# Patient Record
Sex: Female | Born: 1990 | Race: White | Hispanic: No | Smoking: Never smoker
Health system: Southern US, Community
[De-identification: ages and names within clinical notes are randomized; demographics above are authoritative.]

---

## 2020-05-05 ENCOUNTER — Observation Stay (HOSPITAL_BASED_OUTPATIENT_CLINIC_OR_DEPARTMENT_OTHER)
Admission: EM | Admit: 2020-05-05 | Discharge: 2020-05-07 | Disposition: A | Discharge status: Home / Self Care | Payer: 59 | Attending: Surgery | Admitting: Surgery

## 2020-05-05 ENCOUNTER — Other Ambulatory Visit: Payer: Self-pay

## 2020-05-05 ENCOUNTER — Emergency Department (HOSPITAL_BASED_OUTPATIENT_CLINIC_OR_DEPARTMENT_OTHER): Payer: 59

## 2020-05-05 ENCOUNTER — Encounter (HOSPITAL_BASED_OUTPATIENT_CLINIC_OR_DEPARTMENT_OTHER): Payer: Self-pay

## 2020-05-05 DIAGNOSIS — R1011 Right upper quadrant pain: Secondary | ICD-10-CM | POA: Diagnosis present

## 2020-05-05 DIAGNOSIS — Z20822 Contact with and (suspected) exposure to covid-19: Secondary | ICD-10-CM | POA: Diagnosis not present

## 2020-05-05 DIAGNOSIS — K8050 Calculus of bile duct without cholangitis or cholecystitis without obstruction: Secondary | ICD-10-CM

## 2020-05-05 DIAGNOSIS — K810 Acute cholecystitis: Principal | ICD-10-CM | POA: Insufficient documentation

## 2020-05-05 LAB — CBC WITH DIFFERENTIAL/PLATELET
Abs Immature Granulocytes: 0.02 10*3/uL (ref 0.00–0.07)
Basophils Absolute: 0.1 10*3/uL (ref 0.0–0.1)
Basophils Relative: 1 %
Eosinophils Absolute: 0.2 10*3/uL (ref 0.0–0.5)
Eosinophils Relative: 2 %
HCT: 38.6 % (ref 36.0–46.0)
Hemoglobin: 12.7 g/dL (ref 12.0–15.0)
Immature Granulocytes: 0 %
Lymphocytes Relative: 34 %
Lymphs Abs: 2.5 10*3/uL (ref 0.7–4.0)
MCH: 29.1 pg (ref 26.0–34.0)
MCHC: 32.9 g/dL (ref 30.0–36.0)
MCV: 88.5 fL (ref 80.0–100.0)
Monocytes Absolute: 0.6 10*3/uL (ref 0.1–1.0)
Monocytes Relative: 8 %
Neutro Abs: 4.1 10*3/uL (ref 1.7–7.7)
Neutrophils Relative %: 55 %
Platelets: 223 10*3/uL (ref 150–400)
RBC: 4.36 MIL/uL (ref 3.87–5.11)
RDW: 12.9 % (ref 11.5–15.5)
WBC: 7.4 10*3/uL (ref 4.0–10.5)
nRBC: 0 % (ref 0.0–0.2)

## 2020-05-05 LAB — COMPREHENSIVE METABOLIC PANEL
ALT: 14 U/L (ref 0–44)
AST: 17 U/L (ref 15–41)
Albumin: 4 g/dL (ref 3.5–5.0)
Alkaline Phosphatase: 31 U/L — ABNORMAL LOW (ref 38–126)
Anion gap: 7 (ref 5–15)
BUN: 17 mg/dL (ref 6–20)
CO2: 26 mmol/L (ref 22–32)
Calcium: 8.9 mg/dL (ref 8.9–10.3)
Chloride: 103 mmol/L (ref 98–111)
Creatinine, Ser: 0.62 mg/dL (ref 0.44–1.00)
GFR, Estimated: >60 mL/min (ref >60)
Glucose, Bld: 101 mg/dL — ABNORMAL HIGH (ref 70–99)
Potassium: 3.7 mmol/L (ref 3.5–5.1)
Sodium: 136 mmol/L (ref 135–145)
Total Bilirubin: 0.5 mg/dL (ref 0.3–1.2)
Total Protein: 7.2 g/dL (ref 6.5–8.1)

## 2020-05-05 LAB — URINALYSIS, ROUTINE W REFLEX MICROSCOPIC
Bilirubin Urine: NEGATIVE
Glucose, UA: NEGATIVE mg/dL
Hgb urine dipstick: NEGATIVE
Ketones, ur: NEGATIVE mg/dL
Leukocytes,Ua: NEGATIVE
Nitrite: NEGATIVE
Protein, ur: NEGATIVE mg/dL
Specific Gravity, Urine: 1.015 (ref 1.005–1.030)
pH: 7.5 (ref 5.0–8.0)

## 2020-05-05 LAB — PREGNANCY, URINE: Preg Test, Ur: NEGATIVE

## 2020-05-05 LAB — RESP PANEL BY RT-PCR (FLU A&B, COVID) ARPGX2
Influenza A by PCR: NEGATIVE
Influenza B by PCR: NEGATIVE
SARS Coronavirus 2 by RT PCR: NEGATIVE

## 2020-05-05 LAB — LIPASE, BLOOD: Lipase: 40 U/L (ref 11–51)

## 2020-05-05 MED ORDER — ONDANSETRON 4 MG PO TBDP: 4.0000 mg | ORAL_TABLET | Freq: Four times a day (QID) | ORAL | Status: DC | PRN

## 2020-05-05 MED ORDER — ONDANSETRON HCL 4 MG/2ML IJ SOLN
4.0000 mg | Freq: Four times a day (QID) | INTRAMUSCULAR | Status: DC | PRN
  Administered 2020-05-06 – 2020-05-07 (×3): 4 mg via INTRAVENOUS
  Filled 2020-05-05 (×3): qty 2

## 2020-05-05 MED ORDER — PROMETHAZINE HCL 25 MG/ML IJ SOLN
12.5000 mg | Freq: Once | INTRAMUSCULAR | Status: AC
  Administered 2020-05-05: 12.5 mg via INTRAVENOUS
  Filled 2020-05-05: qty 1

## 2020-05-05 MED ORDER — ENOXAPARIN SODIUM 40 MG/0.4ML ~~LOC~~ SOLN
40.0000 mg | SUBCUTANEOUS | Status: DC
  Administered 2020-05-05 – 2020-05-06 (×2): 40 mg via SUBCUTANEOUS
  Filled 2020-05-05 (×2): qty 0.4

## 2020-05-05 MED ORDER — ONDANSETRON 4 MG PO TBDP
4.0000 mg | ORAL_TABLET | Freq: Four times a day (QID) | ORAL | Status: DC | PRN
  Filled 2020-05-05: qty 1

## 2020-05-05 MED ORDER — FAMOTIDINE IN NACL 20-0.9 MG/50ML-% IV SOLN
20.0000 mg | Freq: Once | INTRAVENOUS | Status: AC
  Administered 2020-05-05: 20 mg via INTRAVENOUS
  Filled 2020-05-05: qty 50

## 2020-05-05 MED ORDER — ONDANSETRON HCL 4 MG/2ML IJ SOLN
4.0000 mg | Freq: Once | INTRAMUSCULAR | Status: AC
  Administered 2020-05-05: 4 mg via INTRAVENOUS
  Filled 2020-05-05: qty 2

## 2020-05-05 MED ORDER — KETOROLAC TROMETHAMINE 30 MG/ML IJ SOLN: 30.0000 mg | Freq: Once | INTRAMUSCULAR | Status: DC

## 2020-05-05 MED ORDER — ONDANSETRON HCL 4 MG/2ML IJ SOLN: 4.0000 mg | Freq: Four times a day (QID) | INTRAMUSCULAR | Status: DC | PRN

## 2020-05-05 MED ORDER — CIPROFLOXACIN IN D5W 400 MG/200ML IV SOLN
400.0000 mg | Freq: Two times a day (BID) | INTRAVENOUS | Status: DC
  Administered 2020-05-05 – 2020-05-06 (×2): 400 mg via INTRAVENOUS
  Filled 2020-05-05 (×2): qty 200

## 2020-05-05 MED ORDER — DEXTROSE-NACL 5-0.9 % IV SOLN: INTRAVENOUS | Status: DC

## 2020-05-05 MED ORDER — HYDROMORPHONE HCL 1 MG/ML IJ SOLN
1.0000 mg | INTRAMUSCULAR | Status: DC | PRN
  Administered 2020-05-05: 1 mg via INTRAVENOUS
  Filled 2020-05-05: qty 1

## 2020-05-05 MED ORDER — FENTANYL CITRATE (PF) 100 MCG/2ML IJ SOLN
50.0000 ug | Freq: Once | INTRAMUSCULAR | Status: AC
  Administered 2020-05-05: 50 ug via INTRAVENOUS
  Filled 2020-05-05: qty 2

## 2020-05-05 MED ORDER — METOPROLOL TARTRATE 5 MG/5ML IV SOLN: 5.0000 mg | Freq: Four times a day (QID) | INTRAVENOUS | Status: DC | PRN

## 2020-05-05 MED ORDER — HYDROMORPHONE HCL 1 MG/ML IJ SOLN: 1.0000 mg | INTRAMUSCULAR | Status: DC | PRN

## 2020-05-05 MED ORDER — ZOLPIDEM TARTRATE 5 MG PO TABS
5.0000 mg | ORAL_TABLET | Freq: Every evening | ORAL | Status: DC | PRN
Start: 2020-05-05 — End: 2020-05-07

## 2020-05-05 MED ORDER — CIPROFLOXACIN IN D5W 400 MG/200ML IV SOLN: 400.0000 mg | Freq: Two times a day (BID) | INTRAVENOUS | Status: DC

## 2020-05-05 MED ORDER — ENOXAPARIN SODIUM 40 MG/0.4ML ~~LOC~~ SOLN: 40.0000 mg | SUBCUTANEOUS | Status: DC

## 2020-05-05 MED ORDER — SODIUM CHLORIDE 0.9 % IV BOLUS
1000.0000 mL | Freq: Once | INTRAVENOUS | Status: AC
  Administered 2020-05-05: 1000 mL via INTRAVENOUS

## 2020-05-05 MED ORDER — SIMETHICONE 80 MG PO CHEW
40.0000 mg | CHEWABLE_TABLET | Freq: Four times a day (QID) | ORAL | Status: DC | PRN
  Administered 2020-05-06 – 2020-05-07 (×2): 40 mg via ORAL
  Filled 2020-05-05 (×3): qty 1

## 2020-05-05 MED ORDER — MORPHINE SULFATE (PF) 4 MG/ML IV SOLN
4.0000 mg | Freq: Once | INTRAVENOUS | Status: AC
  Administered 2020-05-05: 4 mg via INTRAVENOUS
  Filled 2020-05-05: qty 1

## 2020-05-05 MED ORDER — ZOLPIDEM TARTRATE 5 MG PO TABS: 5.0000 mg | ORAL_TABLET | Freq: Every evening | ORAL | Status: DC | PRN

## 2020-05-05 NOTE — ED Triage Notes (Signed)
Pt c/o upper abd pain since June 2021-states she was sent from [facility]

## 2020-05-05 NOTE — ED Notes (Signed)
ED Provider at bedside. 

## 2020-05-05 NOTE — ED Notes (Signed)
Pt reports RUQ and mid upper abdominal pain off and on since June. Pt went to [facility] for sinus infection. States RUQ pain for 2 days which is worse than usual and they sent pt to [facility] for evaluation.

## 2020-05-05 NOTE — ED Provider Notes (Addendum)
[facility] EMERGENCY DEPARTMENT Provider Note   CSN: [id number] Arrival date & time: 05/05/20  1543     History Chief Complaint  Patient presents with  . Abdominal Pain    Ariel Hall is a 30 y.o. female here presenting with abdominal pain and vomiting.  Patient states that she has been having intermittent upper abdominal pain and vomiting since June.  She states that she had Covid in August and since then she had epigastric pain that is intermittent.  She states that she was told that she had a stomach ulcer but never had endoscopy.  She is on Prilosec as needed.  She states that for the last 2 days, she has worsening abdominal pain and vomiting.  She also has sinus congestion as well.  She states that she did not have any Covid exposures recently and received the vaccine but she wants to get tested for Covid.  She went to urgent care and was sent in for further evaluation.  Denies any abdominal surgeries in the past.  Denies any lower abdominal pain or pelvic discharge.   The history is provided by the patient.       History reviewed. No pertinent past medical history.  There are no problems to display for this patient.   History reviewed. No pertinent surgical history.   OB History   No obstetric history on file.     No family history on file.  Social History   Tobacco Use  . Smoking status: Never Smoker  . Smokeless tobacco: Never Used  Vaping Use  . Vaping Use: Never used  Substance Use Topics  . Alcohol use: Not Currently  . Drug use: Not Currently    Home Medications Prior to Admission medications   Not on File    Allergies    Amoxil [amoxicillin]  Review of Systems   Review of Systems  Gastrointestinal: Positive for abdominal pain and vomiting.  All other systems reviewed and are negative.   Physical Exam Updated Vital Signs BP 117/68 (BP Location: Right Arm)   Pulse 79   Temp 98.3 F (36.8 C) (Oral)   Resp 18   Ht 5\' 5"  (1.651  m)   Wt 76.2 kg   LMP 04/08/2020   SpO2 95%   BMI 27.96 kg/m   Physical Exam Vitals and nursing note reviewed.  Constitutional:      Comments: Slightly uncomfortable, vomiting   HENT:     Head: Normocephalic.     Mouth/Throat:     Pharynx: Oropharynx is clear.  Eyes:     Extraocular Movements: Extraocular movements intact.     Pupils: Pupils are equal, round, and reactive to light.  Cardiovascular:     Rate and Rhythm: Normal rate and regular rhythm.     Heart sounds: Normal heart sounds.  Pulmonary:     Effort: Pulmonary effort is normal.     Breath sounds: Normal breath sounds.  Abdominal:     General: Abdomen is flat.     Comments: Mild RUQ and epigastric tenderness, mild R CVAT   Skin:    General: Skin is warm.  Neurological:     General: No focal deficit present.  Psychiatric:        Mood and Affect: Mood normal.        Behavior: Behavior normal.     ED Results / Procedures / Treatments   Labs (all labs ordered are listed, but only abnormal results are displayed) Labs Reviewed  COMPREHENSIVE METABOLIC  PANEL - Abnormal; Notable for the following components:      Result Value   Glucose, Bld 101 (*)    Alkaline Phosphatase 31 (*)    All other components within normal limits  SARS CORONAVIRUS 2 (TAT 6-24 HRS)  RESP PANEL BY RT-PCR (FLU A&B, COVID) ARPGX2  URINALYSIS, ROUTINE W REFLEX MICROSCOPIC  PREGNANCY, URINE  CBC WITH DIFFERENTIAL/PLATELET  LIPASE, BLOOD    EKG None  Radiology US Abdomen Complete  Result Date: 05/05/2020 CLINICAL DATA:  Right upper quadrant pain, nausea and vomiting over the last 2 days. EXAM: ABDOMEN ULTRASOUND COMPLETE COMPARISON:  None. FINDINGS: Gallbladder: 2 cm gallstone in the gallbladder neck. Positive Murphy sign. Wall thickness upper limits of normal. Findings worrisome for potential cholecystitis. Common bile duct: Diameter: 2.1 mm, normal Liver: No focal lesion identified. Within normal limits in parenchymal echogenicity.  Portal vein is patent on color Doppler imaging with normal direction of blood flow towards the liver. IVC: No abnormality visualized. Pancreas: Poorly seen because of overlying bowel gas. Spleen: Size and appearance within normal limits. Right Kidney: Length: 11.4 cm. Echogenicity within normal limits. No mass or hydronephrosis visualized. Left Kidney: Length: 10.3 cm. Echogenicity within normal limits. No mass or hydronephrosis visualized. Abdominal aorta: No aneurysm visualized. Other findings: No ascites. IMPRESSION: 2 cm gallstone lodged in the gallbladder neck. Positive sonographic Murphy sign. Wall thickness within normal limits. No intra or extrahepatic biliary ductal dilatation. Findings could indicate early cholecystitis. Consider nuclear medicine study if question persists. Electronically Signed   By: Paulina Fusi M.D.   On: 05/05/2020 17:15    Procedures Procedures   Medications Ordered in ED Medications  morphine 4 MG/ML injection 4 mg (has no administration in time range)  sodium chloride 0.9 % bolus 1,000 mL (1,000 mLs Intravenous New Bag/Given 05/05/20 1701)  ondansetron (ZOFRAN) injection 4 mg (4 mg Intravenous Given 05/05/20 1635)  famotidine (PEPCID) IVPB 20 mg premix (0 mg Intravenous Stopped 05/05/20 1730)    ED Course  I have reviewed the triage vital signs and the nursing notes.  Pertinent labs & imaging results that were available during my care of the patient were reviewed by me and considered in my medical decision making (see chart for details).    MDM Rules/Calculators/A&P                         Ariel Hall is a 30 y.o. female here presenting with right upper quadrant pain.  Consider biliary colic versus stomach ulcer versus gastritis versus renal colic.  Plan to get right upper quadrant ultrasound and renal ultrasound.  We will also get CBC and CMP and lipase and urinalysis.  Will hydrate and give Pepcid and reassess.  5:37 PM LFTs normal.  Ultrasound showed 2 cm  gallstone lodged in the neck with positive sonographic Murphy sign.  Patient is still in pain despite pain meds.  I talked to Dr. Luisa Hart from surgery.  He will see patient at [facility] long ED.  Dr. Freida Busman will be the accepting doc at [facility] long.   Final Clinical Impression(s) / ED Diagnoses Final diagnoses:  None    Rx / DC Orders ED Discharge Orders    None       Charlynne Pander, MD 05/05/20 1738    Charlynne Pander, MD 05/05/20 [phone]

## 2020-05-05 NOTE — H&P (Signed)
Ariel Hall is an 30 y.o. female.   Chief Complaint: Abdominal pain HPI: Patient transferred from [facility] after being seen and evaluated for right upper quadrant abdominal pain.  She had pain for the last day.  Location is right upper quadrant of her abdomen which is sharp.  It was made better by pain medication.  Once the pain medicine wore off the pain returned.  She has  also had significant nausea and vomiting with it.  Ultrasound showed gallstones with some mild pericholecystic fluid and thickening.  Her white count and LFTs are normal.  She was transferred to [facility] long for further care.  Upon evaluation she still has some abdominal pain but is better with the pain medicine.  She still complains of some pain under her right rib cage.  She still has nausea.  No fever or chills.  History reviewed. No pertinent past medical history.  History reviewed. No pertinent surgical history.  No family history on file. Social History:  reports that she has never smoked. She has never used smokeless tobacco. She reports previous alcohol use. She reports previous drug use.  Allergies:  Allergies  Allergen Reactions  . Amoxil [Amoxicillin] Hives and Itching    High fever    (Not in a hospital admission)   Results for orders placed or performed during the hospital encounter of 05/05/20 (from the past 48 hour(s))  Urinalysis, Routine w reflex microscopic Urine, Clean Catch     Status: None   Collection Time: 05/05/20  3:50 PM  Result Value Ref Range   Color, Urine YELLOW YELLOW   APPearance CLEAR CLEAR   Specific Gravity, Urine 1.015 1.005 - 1.030   pH 7.5 5.0 - 8.0   Glucose, UA NEGATIVE NEGATIVE mg/dL   Hgb urine dipstick NEGATIVE NEGATIVE   Bilirubin Urine NEGATIVE NEGATIVE   Ketones, ur NEGATIVE NEGATIVE mg/dL   Protein, ur NEGATIVE NEGATIVE mg/dL   Nitrite NEGATIVE NEGATIVE   Leukocytes,Ua NEGATIVE NEGATIVE    Comment: Microscopic not done on urines with negative  protein, blood, leukocytes, nitrite, or glucose < 500 mg/dL. Performed at [facility], [address] [place], [place] [address]   Pregnancy, urine     Status: None   Collection Time: 05/05/20  3:50 PM  Result Value Ref Range   Preg Test, Ur NEGATIVE NEGATIVE    Comment:        THE SENSITIVITY OF THIS METHODOLOGY IS >20 mIU/mL. Performed at [facility], [address] [place], [place] [address]   Resp Panel by RT-PCR (Flu A&B, Covid) Nasopharyngeal Swab     Status: None   Collection Time: 05/05/20  4:28 PM   Specimen: Nasopharyngeal Swab; Nasopharyngeal(NP) swabs in vial transport medium  Result Value Ref Range   SARS Coronavirus 2 by RT PCR NEGATIVE NEGATIVE    Comment: (NOTE) SARS-CoV-2 target nucleic acids are NOT DETECTED.  The SARS-CoV-2 RNA is generally detectable in upper respiratory specimens during the acute phase of infection. The lowest concentration of SARS-CoV-2 viral copies this assay can detect is 138 copies/mL. A negative result does not preclude SARS-Cov-2 infection and should not be used as the sole basis for treatment or other patient management decisions. A negative result may occur with  improper specimen collection/handling, submission of specimen other than nasopharyngeal swab, presence of viral mutation(s) within the areas targeted by this assay, and inadequate number of viral copies(<138 copies/mL). A negative result must be combined with clinical observations,  patient history, and epidemiological information. The expected result is Negative.  Fact Sheet for Patients:  BloggerCourse.com  Fact Sheet for Healthcare Providers:  SeriousBroker.it  This test is no t yet approved or cleared by the [place] FDA and  has been authorized for detection and/or diagnosis of SARS-CoV-2 by FDA under an Emergency Use Authorization (EUA). This EUA will remain  in effect (meaning this test  can be used) for the duration of the COVID-19 declaration under Section 564(b)(1) of the Act, 21 U.S.C.section 360bbb-3(b)(1), unless the authorization is terminated  or revoked sooner.       Influenza A by PCR NEGATIVE NEGATIVE   Influenza B by PCR NEGATIVE NEGATIVE    Comment: (NOTE) The Xpert Xpress SARS-CoV-2/FLU/RSV plus assay is intended as an aid in the diagnosis of influenza from Nasopharyngeal swab specimens and should not be used as a sole basis for treatment. Nasal washings and aspirates are unacceptable for Xpert Xpress SARS-CoV-2/FLU/RSV testing.  Fact Sheet for Patients: BloggerCourse.com  Fact Sheet for Healthcare Providers: SeriousBroker.it  This test is not yet approved or cleared by the [place] FDA and has been authorized for detection and/or diagnosis of SARS-CoV-2 by FDA under an Emergency Use Authorization (EUA). This EUA will remain in effect (meaning this test can be used) for the duration of the COVID-19 declaration under Section 564(b)(1) of the Act, 21 U.S.C. section 360bbb-3(b)(1), unless the authorization is terminated or revoked.  Performed at [facility], [address], [place], [place] [address]   CBC with Differential/Platelet     Status: None   Collection Time: 05/05/20  4:36 PM  Result Value Ref Range   WBC 7.4 4.0 - 10.5 K/uL   RBC 4.36 3.87 - 5.11 MIL/uL   Hemoglobin 12.7 12.0 - 15.0 g/dL   HCT 36.6 29.4 - 76.5 %   MCV 88.5 80.0 - 100.0 fL   MCH 29.1 26.0 - 34.0 pg   MCHC 32.9 30.0 - 36.0 g/dL   RDW 46.5 03.5 - 46.5 %   Platelets 223 150 - 400 K/uL   nRBC 0.0 0.0 - 0.2 %   Neutrophils Relative % 55 %   Neutro Abs 4.1 1.7 - 7.7 K/uL   Lymphocytes Relative 34 %   Lymphs Abs 2.5 0.7 - 4.0 K/uL   Monocytes Relative 8 %   Monocytes Absolute 0.6 0.1 - 1.0 K/uL   Eosinophils Relative 2 %   Eosinophils Absolute 0.2 0.0 - 0.5 K/uL   Basophils Relative 1 %   Basophils  Absolute 0.1 0.0 - 0.1 K/uL   Immature Granulocytes 0 %   Abs Immature Granulocytes 0.02 0.00 - 0.07 K/uL    Comment: Performed at [facility], [address] [place], [place] [address]  Comprehensive metabolic panel     Status: Abnormal   Collection Time: 05/05/20  4:36 PM  Result Value Ref Range   Sodium 136 135 - 145 mmol/L   Potassium 3.7 3.5 - 5.1 mmol/L   Chloride 103 98 - 111 mmol/L   CO2 26 22 - 32 mmol/L   Glucose, Bld 101 (H) 70 - 99 mg/dL    Comment: Glucose reference range applies only to samples taken after fasting for at least 8 hours.   BUN 17 6 - 20 mg/dL   Creatinine, Ser 5.17 0.44 - 1.00 mg/dL   Calcium 8.9 8.9 - 00.1 mg/dL   Total Protein 7.2 6.5 - 8.1 g/dL   Albumin 4.0 3.5 - 5.0  g/dL   AST 17 15 - 41 U/L   ALT 14 0 - 44 U/L   Alkaline Phosphatase 31 (L) 38 - 126 U/L   Total Bilirubin 0.5 0.3 - 1.2 mg/dL   GFR, Estimated >88 >41 mL/min    Comment: (NOTE) Calculated using the CKD-EPI Creatinine Equation (2021)    Anion gap 7 5 - 15    Comment: Performed at [facility], [address] [place], [place] [address]  Lipase, blood     Status: None   Collection Time: 05/05/20  4:36 PM  Result Value Ref Range   Lipase 40 11 - 51 U/L    Comment: Performed at [facility], [address] [place], [place] [address]   US Abdomen Complete  Result Date: 05/05/2020 CLINICAL DATA:  Right upper quadrant pain, nausea and vomiting over the last 2 days. EXAM: ABDOMEN ULTRASOUND COMPLETE COMPARISON:  None. FINDINGS: Gallbladder: 2 cm gallstone in the gallbladder neck. Positive Murphy sign. Wall thickness upper limits of normal. Findings worrisome for potential cholecystitis. Common bile duct: Diameter: 2.1 mm, normal Liver: No focal lesion identified. Within normal limits in parenchymal echogenicity. Portal vein is patent on color Doppler imaging with normal direction of blood flow towards the liver. IVC: No abnormality visualized. Pancreas:  Poorly seen because of overlying bowel gas. Spleen: Size and appearance within normal limits. Right Kidney: Length: 11.4 cm. Echogenicity within normal limits. No mass or hydronephrosis visualized. Left Kidney: Length: 10.3 cm. Echogenicity within normal limits. No mass or hydronephrosis visualized. Abdominal aorta: No aneurysm visualized. Other findings: No ascites. IMPRESSION: 2 cm gallstone lodged in the gallbladder neck. Positive sonographic Murphy sign. Wall thickness within normal limits. No intra or extrahepatic biliary ductal dilatation. Findings could indicate early cholecystitis. Consider nuclear medicine study if question persists. Electronically Signed   By: Paulina Fusi M.D.   On: 05/05/2020 17:15    Review of Systems  Gastrointestinal: Positive for abdominal pain, nausea and vomiting.  All other systems reviewed and are negative.   Blood pressure 128/74, pulse 86, temperature 98.3 F (36.8 C), temperature source Oral, resp. rate 18, height 5\' 5"  (1.651 m), weight 76.2 kg, last menstrual period 04/08/2020, SpO2 100 %. Physical Exam HENT:     Head: Normocephalic and atraumatic.     Mouth/Throat:     Mouth: Mucous membranes are moist.  Cardiovascular:     Rate and Rhythm: Normal rate and regular rhythm.  Pulmonary:     Effort: Pulmonary effort is normal.     Breath sounds: Normal breath sounds.  Abdominal:     General: Abdomen is flat.     Tenderness: There is abdominal tenderness in the right upper quadrant. Positive signs include Murphy's sign.  Skin:    General: Skin is warm and dry.  Neurological:     General: No focal deficit present.     Mental Status: She is alert and oriented to person, place, and time.  Psychiatric:        Mood and Affect: Mood normal.        Behavior: Behavior normal.      Assessment/Plan Acute cholecystitis-admit for IV fluids, IV antibiotics, and laparoscopic cholecystectomy in a.m.  Allow clear liquids until midnight and n.p.o. after  midnight.  Plan discussed with patient and husband at bedside.  They understand and agree to proceed.  04/10/2020, MD 05/05/2020, 8:20 PM

## 2020-05-05 NOTE — ED Provider Notes (Signed)
MSE was initiated and I personally evaluated the patient and placed orders (if any) at  7:45 PM on May 05, 2020.  Patient self Mr. Ariel Hall to be seen by general surgery.  Discussed with Dr. Luisa Hart he will see patient The patient appears stable so that the remainder of the MSE may be completed by another provider.   Lorre Nick, MD 05/05/20 1945

## 2020-05-06 ENCOUNTER — Inpatient Hospital Stay (HOSPITAL_COMMUNITY): Payer: 59 | Admitting: Anesthesiology

## 2020-05-06 ENCOUNTER — Encounter (HOSPITAL_COMMUNITY): Payer: Self-pay

## 2020-05-06 ENCOUNTER — Encounter (HOSPITAL_COMMUNITY)
Admission: EM | Disposition: A | Discharge status: Home / Self Care | Payer: Self-pay | Source: Home / Self Care | Attending: Emergency Medicine

## 2020-05-06 HISTORY — PX: CHOLECYSTECTOMY: SHX55

## 2020-05-06 LAB — COMPREHENSIVE METABOLIC PANEL
ALT: 38 U/L (ref 0–44)
AST: 39 U/L (ref 15–41)
Albumin: 3.1 g/dL — ABNORMAL LOW (ref 3.5–5.0)
Alkaline Phosphatase: 27 U/L — ABNORMAL LOW (ref 38–126)
Anion gap: 7 (ref 5–15)
BUN: 13 mg/dL (ref 6–20)
CO2: 25 mmol/L (ref 22–32)
Calcium: 8.1 mg/dL — ABNORMAL LOW (ref 8.9–10.3)
Chloride: 105 mmol/L (ref 98–111)
Creatinine, Ser: 0.76 mg/dL (ref 0.44–1.00)
GFR, Estimated: >60 mL/min (ref >60)
Glucose, Bld: 101 mg/dL — ABNORMAL HIGH (ref 70–99)
Potassium: 3.9 mmol/L (ref 3.5–5.1)
Sodium: 137 mmol/L (ref 135–145)
Total Bilirubin: 1.2 mg/dL (ref 0.3–1.2)
Total Protein: 5.6 g/dL — ABNORMAL LOW (ref 6.5–8.1)

## 2020-05-06 LAB — CBC
HCT: 34.5 % — ABNORMAL LOW (ref 36.0–46.0)
Hemoglobin: 10.9 g/dL — ABNORMAL LOW (ref 12.0–15.0)
MCH: 29.5 pg (ref 26.0–34.0)
MCHC: 31.6 g/dL (ref 30.0–36.0)
MCV: 93.5 fL (ref 80.0–100.0)
Platelets: 159 10*3/uL (ref 150–400)
RBC: 3.69 MIL/uL — ABNORMAL LOW (ref 3.87–5.11)
RDW: 13 % (ref 11.5–15.5)
WBC: 5.5 10*3/uL (ref 4.0–10.5)
nRBC: 0 % (ref 0.0–0.2)

## 2020-05-06 LAB — SURGICAL PCR SCREEN
MRSA, PCR: NEGATIVE
Staphylococcus aureus: POSITIVE — AB

## 2020-05-06 LAB — HIV ANTIBODY (ROUTINE TESTING W REFLEX): HIV Screen 4th Generation wRfx: NONREACTIVE

## 2020-05-06 SURGERY — LAPAROSCOPIC CHOLECYSTECTOMY
Anesthesia: General | Site: Abdomen

## 2020-05-06 MED ORDER — PHENYLEPHRINE 40 MCG/ML (10ML) SYRINGE FOR IV PUSH (FOR BLOOD PRESSURE SUPPORT)
PREFILLED_SYRINGE | INTRAVENOUS | Status: DC | PRN
  Administered 2020-05-06: 80 ug via INTRAVENOUS

## 2020-05-06 MED ORDER — BUPIVACAINE-EPINEPHRINE 0.25% -1:200000 IJ SOLN
INTRAMUSCULAR | Status: DC | PRN
  Administered 2020-05-06: 14 mL

## 2020-05-06 MED ORDER — MEPERIDINE HCL 50 MG/ML IJ SOLN: 6.2500 mg | INTRAMUSCULAR | Status: DC | PRN

## 2020-05-06 MED ORDER — LACTATED RINGERS IR SOLN
Status: DC | PRN
  Administered 2020-05-06: 1000 mL

## 2020-05-06 MED ORDER — LIDOCAINE HCL (PF) 2 % IJ SOLN
INTRAMUSCULAR | Status: AC
  Filled 2020-05-06: qty 5

## 2020-05-06 MED ORDER — DEXTROSE-NACL 5-0.9 % IV SOLN: INTRAVENOUS | Status: DC

## 2020-05-06 MED ORDER — LACTATED RINGERS IV SOLN: INTRAVENOUS | Status: DC | PRN

## 2020-05-06 MED ORDER — MIDAZOLAM HCL 2 MG/2ML IJ SOLN
INTRAMUSCULAR | Status: AC
  Filled 2020-05-06: qty 2

## 2020-05-06 MED ORDER — DEXMEDETOMIDINE (PRECEDEX) IN NS 20 MCG/5ML (4 MCG/ML) IV SYRINGE
PREFILLED_SYRINGE | INTRAVENOUS | Status: AC
  Filled 2020-05-06: qty 5

## 2020-05-06 MED ORDER — DEXAMETHASONE SODIUM PHOSPHATE 10 MG/ML IJ SOLN
INTRAMUSCULAR | Status: AC
  Filled 2020-05-06: qty 1

## 2020-05-06 MED ORDER — LACTATED RINGERS IV SOLN: INTRAVENOUS | Status: DC

## 2020-05-06 MED ORDER — OXYCODONE HCL 5 MG PO TABS
ORAL_TABLET | ORAL | Status: AC
  Filled 2020-05-06: qty 1

## 2020-05-06 MED ORDER — BUPIVACAINE-EPINEPHRINE (PF) 0.25% -1:200000 IJ SOLN
INTRAMUSCULAR | Status: AC
  Filled 2020-05-06: qty 30

## 2020-05-06 MED ORDER — ONDANSETRON HCL 4 MG/2ML IJ SOLN
INTRAMUSCULAR | Status: AC
  Filled 2020-05-06: qty 2

## 2020-05-06 MED ORDER — CHLORHEXIDINE GLUCONATE CLOTH 2 % EX PADS: 6.0000 | MEDICATED_PAD | Freq: Every day | CUTANEOUS | Status: DC

## 2020-05-06 MED ORDER — FENTANYL CITRATE (PF) 100 MCG/2ML IJ SOLN: 25.0000 ug | INTRAMUSCULAR | Status: DC | PRN

## 2020-05-06 MED ORDER — SUGAMMADEX SODIUM 200 MG/2ML IV SOLN
INTRAVENOUS | Status: DC | PRN
  Administered 2020-05-06: 200 mg via INTRAVENOUS
  Administered 2020-05-06: 100 mg via INTRAVENOUS

## 2020-05-06 MED ORDER — ACETAMINOPHEN 325 MG PO TABS: 325.0000 mg | ORAL_TABLET | ORAL | Status: DC | PRN

## 2020-05-06 MED ORDER — PROPOFOL 10 MG/ML IV BOLUS
INTRAVENOUS | Status: DC | PRN
  Administered 2020-05-06: 120 mg via INTRAVENOUS
  Administered 2020-05-06: 80 mg via INTRAVENOUS

## 2020-05-06 MED ORDER — OXYCODONE HCL 5 MG PO TABS
5.0000 mg | ORAL_TABLET | ORAL | Status: DC | PRN
  Administered 2020-05-06: 5 mg via ORAL
  Administered 2020-05-07 (×3): 10 mg via ORAL
  Filled 2020-05-06: qty 1
  Filled 2020-05-06 (×3): qty 2

## 2020-05-06 MED ORDER — ROCURONIUM BROMIDE 10 MG/ML (PF) SYRINGE
PREFILLED_SYRINGE | INTRAVENOUS | Status: DC | PRN
  Administered 2020-05-06: 60 mg via INTRAVENOUS

## 2020-05-06 MED ORDER — FENTANYL CITRATE (PF) 250 MCG/5ML IJ SOLN
INTRAMUSCULAR | Status: AC
  Filled 2020-05-06: qty 5

## 2020-05-06 MED ORDER — MIDAZOLAM HCL 5 MG/5ML IJ SOLN
INTRAMUSCULAR | Status: DC | PRN
  Administered 2020-05-06: 2 mg via INTRAVENOUS

## 2020-05-06 MED ORDER — MUPIROCIN 2 % EX OINT
TOPICAL_OINTMENT | Freq: Two times a day (BID) | CUTANEOUS | Status: DC
  Administered 2020-05-07: 1 via NASAL
  Filled 2020-05-06: qty 22

## 2020-05-06 MED ORDER — PROPOFOL 10 MG/ML IV BOLUS
INTRAVENOUS | Status: AC
  Filled 2020-05-06: qty 20

## 2020-05-06 MED ORDER — HYDROMORPHONE HCL 1 MG/ML IJ SOLN
1.0000 mg | INTRAMUSCULAR | Status: DC | PRN
  Administered 2020-05-06 (×3): 1 mg via INTRAVENOUS
  Filled 2020-05-06 (×4): qty 1

## 2020-05-06 MED ORDER — ACETAMINOPHEN 325 MG PO TABS: 650.0000 mg | ORAL_TABLET | Freq: Four times a day (QID) | ORAL | Status: DC | PRN

## 2020-05-06 MED ORDER — OXYCODONE HCL 5 MG PO TABS: 5.0000 mg | ORAL_TABLET | Freq: Four times a day (QID) | ORAL | 0 refills | Status: AC | PRN

## 2020-05-06 MED ORDER — SODIUM CHLORIDE 0.9 % IR SOLN
Status: DC | PRN
  Administered 2020-05-06: 1000 mL

## 2020-05-06 MED ORDER — DEXMEDETOMIDINE (PRECEDEX) IN NS 20 MCG/5ML (4 MCG/ML) IV SYRINGE
PREFILLED_SYRINGE | INTRAVENOUS | Status: DC | PRN
  Administered 2020-05-06: 8 ug via INTRAVENOUS
  Administered 2020-05-06: 12 ug via INTRAVENOUS

## 2020-05-06 MED ORDER — ONDANSETRON HCL 4 MG/2ML IJ SOLN: 4.0000 mg | Freq: Once | INTRAMUSCULAR | Status: DC | PRN

## 2020-05-06 MED ORDER — OXYCODONE HCL 5 MG/5ML PO SOLN
5.0000 mg | Freq: Once | ORAL | Status: AC | PRN
Start: 2020-05-06 — End: 2020-05-06

## 2020-05-06 MED ORDER — MUPIROCIN 2 % EX OINT: 1.0000 "application " | TOPICAL_OINTMENT | Freq: Two times a day (BID) | CUTANEOUS | Status: DC

## 2020-05-06 MED ORDER — DEXAMETHASONE SODIUM PHOSPHATE 10 MG/ML IJ SOLN
INTRAMUSCULAR | Status: DC | PRN
  Administered 2020-05-06: 10 mg via INTRAVENOUS

## 2020-05-06 MED ORDER — ACETAMINOPHEN 160 MG/5ML PO SOLN: 325.0000 mg | ORAL | Status: DC | PRN

## 2020-05-06 MED ORDER — FENTANYL CITRATE (PF) 100 MCG/2ML IJ SOLN
INTRAMUSCULAR | Status: DC | PRN
  Administered 2020-05-06 (×2): 50 ug via INTRAVENOUS
  Administered 2020-05-06: 100 ug via INTRAVENOUS
  Administered 2020-05-06: 50 ug via INTRAVENOUS

## 2020-05-06 MED ORDER — OXYCODONE HCL 5 MG PO TABS
5.0000 mg | ORAL_TABLET | Freq: Once | ORAL | Status: AC | PRN
  Administered 2020-05-06: 5 mg via ORAL

## 2020-05-06 MED ORDER — LIDOCAINE 2% (20 MG/ML) 5 ML SYRINGE
INTRAMUSCULAR | Status: DC | PRN
  Administered 2020-05-06: 60 mg via INTRAVENOUS
  Administered 2020-05-06: 40 mg via INTRAVENOUS

## 2020-05-06 SURGICAL SUPPLY — 40 items
APPLICATOR ARISTA FLEXITIP XL (MISCELLANEOUS) IMPLANT
APPLIER CLIP 5 13 M/L LIGAMAX5 (MISCELLANEOUS) ×2
APPLIER CLIP ROT 10 11.4 M/L (STAPLE)
CABLE HIGH FREQUENCY MONO STRZ (ELECTRODE) ×2 IMPLANT
CHLORAPREP W/TINT 26 (MISCELLANEOUS) ×2 IMPLANT
CLIP APPLIE 5 13 M/L LIGAMAX5 (MISCELLANEOUS) ×1 IMPLANT
CLIP APPLIE ROT 10 11.4 M/L (STAPLE) IMPLANT
COVER MAYO STAND STRL (DRAPES) IMPLANT
COVER SURGICAL LIGHT HANDLE (MISCELLANEOUS) ×2 IMPLANT
COVER WAND RF STERILE (DRAPES) IMPLANT
DECANTER SPIKE VIAL GLASS SM (MISCELLANEOUS) IMPLANT
DERMABOND ADVANCED (GAUZE/BANDAGES/DRESSINGS) ×1
DERMABOND ADVANCED .7 DNX12 (GAUZE/BANDAGES/DRESSINGS) ×1 IMPLANT
DISSECTOR BLUNT TIP ENDO 5MM (MISCELLANEOUS) IMPLANT
DRAPE C-ARM 42X120 X-RAY (DRAPES) IMPLANT
ELECT PENCIL ROCKER SW 15FT (MISCELLANEOUS) ×2 IMPLANT
ELECT REM PT RETURN 15FT ADLT (MISCELLANEOUS) ×2 IMPLANT
GLOVE INDICATOR 8.0 STRL GRN (GLOVE) ×2 IMPLANT
GLOVE SURG ENC MOIS LTX SZ7.5 (GLOVE) ×2 IMPLANT
GOWN STRL REUS W/TWL XL LVL3 (GOWN DISPOSABLE) ×4 IMPLANT
GRASPER SUT TROCAR 14GX15 (MISCELLANEOUS) ×2 IMPLANT
HEMOSTAT ARISTA ABSORB 3G PWDR (HEMOSTASIS) IMPLANT
HEMOSTAT SNOW SURGICEL 2X4 (HEMOSTASIS) IMPLANT
KIT BASIN OR (CUSTOM PROCEDURE TRAY) ×2 IMPLANT
NEEDLE INSUFFLATION 14GA 120MM (NEEDLE) IMPLANT
POUCH SPECIMEN RETRIEVAL 10MM (ENDOMECHANICALS) ×2 IMPLANT
SCISSORS LAP 5X35 DISP (ENDOMECHANICALS) ×2 IMPLANT
SET CHOLANGIOGRAPH MIX (MISCELLANEOUS) IMPLANT
SET IRRIG TUBING LAPAROSCOPIC (IRRIGATION / IRRIGATOR) ×2 IMPLANT
SET TUBE SMOKE EVAC HIGH FLOW (TUBING) ×2 IMPLANT
SLEEVE ADV FIXATION 5X100MM (TROCAR) ×4 IMPLANT
SUT MNCRL AB 4-0 PS2 18 (SUTURE) ×6 IMPLANT
SYR 10ML ECCENTRIC (SYRINGE) ×2 IMPLANT
TOWEL OR 17X26 10 PK STRL BLUE (TOWEL DISPOSABLE) ×2 IMPLANT
TOWEL OR NON WOVEN STRL DISP B (DISPOSABLE) IMPLANT
TRAY LAPAROSCOPIC (CUSTOM PROCEDURE TRAY) ×2 IMPLANT
TROCAR ADV FIXATION 12X100MM (TROCAR) IMPLANT
TROCAR ADV FIXATION 5X100MM (TROCAR) ×2 IMPLANT
TROCAR XCEL BLUNT TIP 100MML (ENDOMECHANICALS) ×2 IMPLANT
TROCAR XCEL NON-BLD 11X100MML (ENDOMECHANICALS) IMPLANT

## 2020-05-06 NOTE — Discharge Summary (Cosign Needed Addendum)
    Patient ID: Ariel Hall [id number] 1990/06/09 30 y.o.  Admit date: 05/05/2020 Discharge date: 05/07/2020  Admitting Diagnosis: cholecystitis  Discharge Diagnosis Patient Active Problem List   Diagnosis Date Noted  . Acute cholecystitis 05/05/2020  . Acute cholecystitis 05/05/2020  s/p lap chole  Consultants none  Reason for Admission: Patient transferred from [facility] after being seen and evaluated for right upper quadrant abdominal pain.  She had pain for the last day.  Location is right upper quadrant of her abdomen which is sharp.  It was made better by pain medication.  Once the pain medicine wore off the pain returned.  She has also had significant nausea and vomiting with it.  Ultrasound showed gallstones with some mild pericholecystic fluid and thickening.  Her white count and LFTs are normal.  She was transferred to [facility] for further care.  Upon evaluation she still has some abdominal pain but is better with the pain medicine.  She still complains of some pain under her right rib cage.  She still has nausea.  No fever or chills.  Procedures Laparoscopic cholecystectomy, Dr. Cliffton Asters 05/06/20  Hospital Course:  The patient was admitted and underwent a laparoscopic cholecystectomy.  The patient tolerated the procedure well.  On POD 1, the patient was tolerating a diet, voiding well, mobilizing, and pain was controlled with oral pain medications.  The patient was stable for DC home at this time with appropriate follow up made.   Physical Exam: Gen: Alert, NAD Pulm: rate and effort normal Abd: soft, appropriately tender, incisions c/d/i, ND  Allergies as of 05/07/2020      Reactions   Amoxil [amoxicillin] Hives, Itching   High fever      Medication List    TAKE these medications   acetaminophen 500 MG tablet Commonly known as: TYLENOL Take 2 tablets (1,000 mg total) by mouth every 6 (six) hours as needed.   ibuprofen 200 MG tablet Commonly  known as: Motrin IB Take 3 tablets (600 mg total) by mouth every 8 (eight) hours as needed.   ondansetron 4 MG disintegrating tablet Commonly known as: ZOFRAN-ODT Take 1 tablet (4 mg total) by mouth every 6 (six) hours as needed for nausea.   oxyCODONE 5 MG immediate release tablet Commonly known as: Oxy IR/ROXICODONE Take 1 tablet (5 mg total) by mouth every 6 (six) hours as needed.         Follow-up Information    Surgery, [facility] Follow up on 05/27/2020.   Specialty: General Surgery Why: 2:30pm, Please arrive 30 minutes prior to appointment for check in and paperwork process Contact information: [address] [place] [address] [phone]               Signed: Letha Cape, [facility] 05/07/2020, 2:49 PM Please see Amion for pager number during day hours 7:00am-4:30pm, 7-11:30am on Weekends

## 2020-05-06 NOTE — Progress Notes (Signed)
Pt decided she wanted to stay as she was still feeling nauseous and having a lot of pain.

## 2020-05-06 NOTE — Anesthesia Postprocedure Evaluation (Signed)
Anesthesia Post Note  Patient: Ariel Hall  Procedure(s) Performed: LAPAROSCOPIC CHOLECYSTECTOMY (N/A Abdomen)     Patient location during evaluation: PACU Anesthesia Type: General Level of consciousness: awake and alert Pain management: pain level controlled Vital Signs Assessment: post-procedure vital signs reviewed and stable Respiratory status: spontaneous breathing, nonlabored ventilation, respiratory function stable and patient connected to nasal cannula oxygen Cardiovascular status: blood pressure returned to baseline and stable Postop Assessment: no apparent nausea or vomiting Anesthetic complications: no   No complications documented.  Last Vitals:  Vitals:   05/06/20 1200 05/06/20 1215  BP: 103/60 112/64  Pulse: 72 74  Resp: 14 14  Temp:    SpO2: 95% 95%    Last Pain:  Vitals:   05/06/20 1215  TempSrc:   PainSc: 8                  Siobahn Worsley

## 2020-05-06 NOTE — Anesthesia Preprocedure Evaluation (Signed)
Anesthesia Evaluation  Patient identified by MRN, date of birth, ID band Patient awake    Reviewed: Allergy & Precautions, H&P , NPO status , Patient's Chart, lab work & pertinent test results, reviewed documented beta blocker date and time   Airway Mallampati: II  TM Distance: >3 FB Neck ROM: full    Dental no notable dental hx.    Pulmonary neg pulmonary ROS,    Pulmonary exam normal breath sounds clear to auscultation       Cardiovascular Exercise Tolerance: Good negative cardio ROS   Rhythm:regular Rate:Normal     Neuro/Psych negative neurological ROS  negative psych ROS   GI/Hepatic negative GI ROS, Neg liver ROS,   Endo/Other  negative endocrine ROS  Renal/GU negative Renal ROS  negative genitourinary   Musculoskeletal   Abdominal   Peds  Hematology  (+) Blood dyscrasia, anemia ,   Anesthesia Other Findings   Reproductive/Obstetrics negative OB ROS                             Anesthesia Physical Anesthesia Plan  ASA: II  Anesthesia Plan: General   Post-op Pain Management:    Induction: Intravenous  PONV Risk Score and Plan: 3 and Ondansetron, Dexamethasone and Midazolam  Airway Management Planned: Oral ETT  Additional Equipment:   Intra-op Plan:   Post-operative Plan: Extubation in OR  Informed Consent: I have reviewed the patients History and Physical, chart, labs and discussed the procedure including the risks, benefits and alternatives for the proposed anesthesia with the patient or authorized representative who has indicated his/her understanding and acceptance.     Dental Advisory Given  Plan Discussed with: CRNA and Anesthesiologist  Anesthesia Plan Comments: (  )        Anesthesia Quick Evaluation

## 2020-05-06 NOTE — Discharge Instructions (Signed)
[facility]  Please arrive at least 30 min before your appointment to complete your check in paperwork.  If you are unable to arrive 30 min prior to your appointment time we may have to cancel or reschedule you. LAPAROSCOPIC SURGERY: POST OP INSTRUCTIONS Always review your discharge instruction sheet given to you by the facility where your surgery was performed. IF YOU HAVE DISABILITY OR FAMILY LEAVE FORMS, YOU MUST BRING THEM TO THE OFFICE FOR PROCESSING.   DO NOT GIVE THEM TO YOUR DOCTOR.  PAIN CONTROL  1. First take acetaminophen (Tylenol) AND/or ibuprofen (Advil) to control your pain after surgery.  Follow directions on package.  Taking acetaminophen (Tylenol) and/or ibuprofen (Advil) regularly after surgery will help to control your pain and lower the amount of prescription pain medication you may need.  You should not take more than 4,000 mg (4 grams) of acetaminophen (Tylenol) in 24 hours.  You should not take ibuprofen (Advil), aleve, motrin, naprosyn or other NSAIDS if you have a history of stomach ulcers or chronic kidney disease.  2. A prescription for pain medication may be given to you upon discharge.  Take your pain medication as prescribed, if you still have uncontrolled pain after taking acetaminophen (Tylenol) or ibuprofen (Advil). 3. Use ice packs to help control pain. 4. If you need a refill on your pain medication, please contact your pharmacy.  They will contact our office to request authorization. Prescriptions will not be filled after 5pm or on week-ends.  HOME MEDICATIONS 5. Take your usually prescribed medications unless otherwise directed.  DIET 6. You should follow a light diet the first few days after arrival home.  Be sure to include lots of fluids daily. Avoid fatty, fried foods.   CONSTIPATION 7. It is common to experience some constipation after surgery and if you are taking pain medication.  Increasing fluid intake and taking a stool  softener (such as Colace) will usually help or prevent this problem from occurring.  A mild laxative (Milk of Magnesia or Miralax) should be taken according to package instructions if there are no bowel movements after 48 hours.  WOUND/INCISION CARE 8. Most patients will experience some swelling and bruising in the area of the incisions.  Ice packs will help.  Swelling and bruising can take several days to resolve.  9. Unless discharge instructions indicate otherwise, follow guidelines below  a. STERI-STRIPS - you may remove your outer bandages 48 hours after surgery, and you may shower at that time.  You have steri-strips (small skin tapes) in place directly over the incision.  These strips should be left on the skin for 7-10 days.   b. DERMABOND/SKIN GLUE - you may shower in 24 hours.  The glue will flake off over the next 2-3 weeks. 10. Any sutures or staples will be removed at the office during your follow-up visit.  ACTIVITIES 11. You may resume regular (light) daily activities beginning the next day--such as daily self-care, walking, climbing stairs--gradually increasing activities as tolerated.  You may have sexual intercourse when it is comfortable.  Refrain from any heavy lifting or straining until approved by your doctor. a. You may drive when you are no longer taking prescription pain medication, you can comfortably wear a seatbelt, and you can safely maneuver your car and apply brakes.  FOLLOW-UP 12. You should see your doctor in the office for a follow-up appointment approximately 2-3 weeks after your surgery.  You should have been given your post-op/follow-up appointment when   your surgery was scheduled.  If you did not receive a post-op/follow-up appointment, make sure that you call for this appointment within a day or two after you arrive home to insure a convenient appointment time.   WHEN TO CALL YOUR DOCTOR: 1. Fever over 101.0 2. Inability to urinate 3. Continued bleeding from  incision. 4. Increased pain, redness, or drainage from the incision. 5. Increasing abdominal pain  The clinic staff is available to answer your questions during regular business hours.  Please don't hesitate to call and ask to speak to one of the nurses for clinical concerns.  If you have a medical emergency, go to the nearest emergency room or call 911.  A surgeon from [facility] is always on call at the hospital. [address], [address], [place], [place]  [address] ? [address] [address], [place], [place]   [address] [phone] ? [phone] ? FAX [phone]  .........   Managing Your Pain After Surgery Without Opioids    Thank you for participating in our program to help patients manage their pain after surgery without opioids. This is part of our effort to provide you with the best care possible, without exposing you or your family to the risk that opioids pose.  What pain can I expect after surgery? You can expect to have some pain after surgery. This is normal. The pain is typically worse the day after surgery, and quickly begins to get better. Many studies have found that many patients are able to manage their pain after surgery with Over-the-Counter (OTC) medications such as Tylenol and Motrin. If you have a condition that does not allow you to take Tylenol or Motrin, notify your surgical team.  How will I manage my pain? The best strategy for controlling your pain after surgery is around the clock pain control with Tylenol (acetaminophen) and Motrin (ibuprofen or Advil). Alternating these medications with each other allows you to maximize your pain control. In addition to Tylenol and Motrin, you can use heating pads or ice packs on your incisions to help reduce your pain.  How will I alternate your regular strength over-the-counter pain medication? You will take a dose of pain medication every three hours. ; Start by taking 650 mg of Tylenol (2 pills of 325  mg) ; 3 hours later take 600 mg of Motrin (3 pills of 200 mg) ; 3 hours after taking the Motrin take 650 mg of Tylenol ; 3 hours after that take 600 mg of Motrin.   - 1 -  See example - if your first dose of Tylenol is at 12:00 PM   12:00 PM Tylenol 650 mg (2 pills of 325 mg)  3:00 PM Motrin 600 mg (3 pills of 200 mg)  6:00 PM Tylenol 650 mg (2 pills of 325 mg)  9:00 PM Motrin 600 mg (3 pills of 200 mg)  Continue alternating every 3 hours   We recommend that you follow this schedule around-the-clock for at least 3 days after surgery, or until you feel that it is no longer needed. Use the table on the last page of this handout to keep track of the medications you are taking. Important: Do not take more than 3000mg of Tylenol or 3200mg of Motrin in a 24-hour period. Do not take ibuprofen/Motrin if you have a history of bleeding stomach ulcers, severe kidney disease, &/or actively taking a blood thinner  What if I still have pain? If you have pain that is not   controlled with the over-the-counter pain medications (Tylenol and Motrin or Advil) you might have what we call "breakthrough" pain. You will receive a prescription for a small amount of an opioid pain medication such as Oxycodone, Tramadol, or Tylenol with Codeine. Use these opioid pills in the first 24 hours after surgery if you have breakthrough pain. Do not take more than 1 pill every 4-6 hours.  If you still have uncontrolled pain after using all opioid pills, don't hesitate to call our staff using the number provided. We will help make sure you are managing your pain in the best way possible, and if necessary, we can provide a prescription for additional pain medication.   Day 1    Time  Name of Medication Number of pills taken  Amount of Acetaminophen  Pain Level   Comments  AM PM       AM PM       AM PM       AM PM       AM PM       AM PM       AM PM       AM PM       Total Daily amount of Acetaminophen Do not  take more than  3,000 mg per day      Day 2    Time  Name of Medication Number of pills taken  Amount of Acetaminophen  Pain Level   Comments  AM PM       AM PM       AM PM       AM PM       AM PM       AM PM       AM PM       AM PM       Total Daily amount of Acetaminophen Do not take more than  3,000 mg per day      Day 3    Time  Name of Medication Number of pills taken  Amount of Acetaminophen  Pain Level   Comments  AM PM       AM PM       AM PM       AM PM          AM PM       AM PM       AM PM       AM PM       Total Daily amount of Acetaminophen Do not take more than  3,000 mg per day      Day 4    Time  Name of Medication Number of pills taken  Amount of Acetaminophen  Pain Level   Comments  AM PM       AM PM       AM PM       AM PM       AM PM       AM PM       AM PM       AM PM       Total Daily amount of Acetaminophen Do not take more than  3,000 mg per day      Day 5    Time  Name of Medication Number of pills taken  Amount of Acetaminophen  Pain Level   Comments  AM PM       AM PM       AM   PM       AM PM       AM PM       AM PM       AM PM       AM PM       Total Daily amount of Acetaminophen Do not take more than  3,000 mg per day       Day 6    Time  Name of Medication Number of pills taken  Amount of Acetaminophen  Pain Level  Comments  AM PM       AM PM       AM PM       AM PM       AM PM       AM PM       AM PM       AM PM       Total Daily amount of Acetaminophen Do not take more than  3,000 mg per day      Day 7    Time  Name of Medication Number of pills taken  Amount of Acetaminophen  Pain Level   Comments  AM PM       AM PM       AM PM       AM PM       AM PM       AM PM       AM PM       AM PM       Total Daily amount of Acetaminophen Do not take more than  3,000 mg per day        For additional information about how and where to safely dispose of unused  opioid medications - https://www.morepowerfulnc.org  Disclaimer: This document contains information and/or instructional materials adapted from [facility] for the typical patient with your condition. It does not replace medical advice from your health care provider because your experience may differ from that of the typical patient. Talk to your health care provider if you have any questions about this document, your condition or your treatment plan. Adapted from [facility]   

## 2020-05-06 NOTE — Transfer of Care (Signed)
Immediate Anesthesia Transfer of Care Note  Patient: Ariel Hall  Procedure(s) Performed: LAPAROSCOPIC CHOLECYSTECTOMY (N/A Abdomen)  Patient Location: PACU  Anesthesia Type:General  Level of Consciousness: awake and alert   Airway & Oxygen Therapy: Patient Spontanous Breathing  Post-op Assessment: Report given to RN, Post -op Vital signs reviewed and stable and Patient moving all extremities X 4  Post vital signs: Reviewed and stable  Last Vitals:  Vitals Value Taken Time  BP 111/70 05/06/20 1140  Temp    Pulse 74 05/06/20 1144  Resp 15 05/06/20 1144  SpO2 94 % 05/06/20 1144  Vitals shown include unvalidated device data.  Last Pain:  Vitals:   05/06/20 0850  TempSrc:   PainSc: 3       Patients Stated Pain Goal: 2 (05/05/20 2246)  Complications: No complications documented.

## 2020-05-06 NOTE — Anesthesia Procedure Notes (Signed)
Procedure Name: Intubation Date/Time: 05/06/2020 9:54 AM Performed by: Lavina Hamman, CRNA Pre-anesthesia Checklist: Patient identified, Emergency Drugs available, Suction available, Patient being monitored and Timeout performed Patient Re-evaluated:Patient Re-evaluated prior to induction Oxygen Delivery Method: Circle system utilized Preoxygenation: Pre-oxygenation with 100% oxygen Induction Type: IV induction Ventilation: Mask ventilation without difficulty Laryngoscope Size: Mac and 3 Grade View: Grade I Tube type: Oral Tube size: 7.0 mm Number of attempts: 1 Airway Equipment and Method: Stylet Placement Confirmation: ETT inserted through vocal cords under direct vision,  positive ETCO2,  CO2 detector and breath sounds checked- equal and bilateral Secured at: 21 cm Tube secured with: Tape Dental Injury: Teeth and Oropharynx as per pre-operative assessment

## 2020-05-06 NOTE — Progress Notes (Signed)
Subjective No acute events. Still with some RUQ pain this morning. Denies n/v.  Objective: Vital signs in last 24 hours: Temp:  [98.1 F (36.7 C)-99.2 F (37.3 C)] 99.2 F (37.3 C) (02/10 0846) Pulse Rate:  [63-101] 86 (02/10 0846) Resp:  [16-18] 16 (02/10 0846) BP: (100-133)/(53-85) 115/64 (02/10 0846) SpO2:  [95 %-100 %] 100 % (02/10 0846) Weight:  [76.2 kg] 76.2 kg (02/09 1552) Last BM Date: 05/04/20  Intake/Output from previous day: 02/09 0701 - 02/10 0700 In: 1904.2 [I.V.:654.2; IV Piggyback:1250] Out: 0  Intake/Output this shift: No intake/output data recorded.  Gen: NAD, comfortable CV: RRR Pulm: Normal work of breathing Abd: Soft, mild RUQ tenderness; non distended; no rebound nor guarding Ext: SCDs in place  Lab Results: CBC  Recent Labs    05/05/20 1636 05/06/20 0511  WBC 7.4 5.5  HGB 12.7 10.9*  HCT 38.6 34.5*  PLT 223 159   BMET Recent Labs    05/05/20 1636 05/06/20 0511  NA 136 137  K 3.7 3.9  CL 103 105  CO2 26 25  GLUCOSE 101* 101*  BUN 17 13  CREATININE 0.62 0.76  CALCIUM 8.9 8.1*   PT/INR No results for input(s): LABPROT, INR in the last 72 hours. ABG No results for input(s): PHART, HCO3 in the last 72 hours.  Invalid input(s): PCO2, PO2  Studies/Results:  Anti-infectives: Anti-infectives (From admission, onward)   Start     Dose/Rate Route Frequency Ordered Stop   05/05/20 2115  ciprofloxacin (CIPRO) IVPB 400 mg        400 mg 200 mL/hr over 60 Minutes Intravenous Every 12 hours 05/05/20 2109     05/05/20 2115  ciprofloxacin (CIPRO) IVPB 400 mg  Status:  Discontinued        400 mg 200 mL/hr over 60 Minutes Intravenous Every 12 hours 05/05/20 2109 05/05/20 2119       Assessment/Plan: Patient Active Problem List   Diagnosis Date Noted  . Acute cholecystitis 05/05/2020  . Acute cholecystitis 05/05/2020   Ms. Mun is a very pleasant 30yo F with acute cholecystitis  -The anatomy and physiology of the hepatobiliary  system was discussed with associated . The pathophysiology of gallbladder disease was discussed at length with associated pictures. -The options for treatment were discussed including ongoing observation which may result in subsequent gallbladder complications (infection, pancreatitis, choledocholithiasis, etc). -The planned procedure, material risks (including, but not limited to, pain, bleeding, infection, scarring, need for blood transfusion, damage to surrounding structures- blood vessels/nerves/viscus/organs, damage to bile duct, bile leak, need for additional procedures, hernia, worsening of pre-existing medical conditions, pancreatitis, pneumonia, heart attack, stroke, death) benefits and alternatives to surgery were discussed at length. I noted a good probability that the procedure would help improve their symptoms. The patient's questions were answered to their satisfaction, they voiced understanding and they elected to proceed with surgery. Additionally, we discussed typical postoperative expectations and the recovery process.   LOS: 1 day   Marin Olp, MD [facility] Use [email or web address] to contact on call provider

## 2020-05-06 NOTE — Op Note (Signed)
05/06/2020 11:27 AM  PATIENT: Ariel Hall  30 y.o. female  Patient Care Team: Patient, No Pcp Per as PCP - General (General Practice)  PRE-OPERATIVE DIAGNOSIS: Acute cholecystitis  POST-OPERATIVE DIAGNOSIS: Same  PROCEDURE: Laparoscopic cholecystectomy  SURGEON: Stephanie Coup. Cherysh Epperly, MD  ANESTHESIA: General endotracheal  EBL: Total I/O In: 600 [I.V.:600] Out: 20 [Blood:20]  DRAINS: None  SPECIMEN: Gallbladder  COUNTS: Sponge, needle and instrument counts were reported correct x2 at the conclusion of the operation  DISPOSITION: PACU in satisfactory condition  COMPLICATIONS: None  FINDINGS: Acutely inflamed appearing appendix with omental adhesions and some chronic adhesions as well. Critical view of safety obtained before clipping or dividing any structures.  DESCRIPTION:  The patient was identified & brought into the operating room. She was then positioned supine on the OR table. SCDs were in place and active during the entire case. She then underwent general endotracheal anesthesia. Pressure points were padded. Hair on the abdomen was clipped by the OR team. The abdomen was prepped and draped in the standard sterile fashion. Antibiotics were administered. A surgical timeout was performed and confirmed our plan.   A periumbilical incision was made. The umbilical stalk was grasped and retracted outwardly. The infraumbilical fascia was identified and incised. The peritoneal cavity was gently entered bluntly. A purse-string 0 Vicryl suture was placed. The Hasson cannula was inserted into the peritoneal cavity and insufflation with CO2 commenced to . A laparoscope was inserted into the peritoneal cavity and inspection confirmed no evidence of trocar site complications. The patient was then positioned in reverse Trendelenburg with slight left side down. 3 additional 33mm trocars were placed along the right subcostal line - one 68mm port in mid subcostal region, another 75mm port  in the right flank near the anterior axillary line, and a third 97mm port in the left subxiphoid region obliquely near the falciform ligament.  The liver and gallbladder were inspected. The gallbladder is acutely inflamed in appearance. The gallbladder fundus was grasped and elevated cephalad.  Omental adhesions to the gallbladder are mixture of both chronic and acute appearing adhesions.  These were taken down sharply.  Hemostasis is verified in the omentum.  There were some filmy duodenal adhesions to the infundibulum that were able to be carefully taken down sharply.  There was a large stone in the infundibulum of her gallbladder.  An additional grasper was then placed on the infundibulum of the gallbladder and the infundibulum was retracted laterally. Staying high on the gallbladder, the peritoneum on both sides of the gallbladder was opened with hook cautery. Gentle blunt dissection was then employed with a Art gallery manager working down into [employer]. The cystic duct was identified and carefully circumferentially dissected. The cystic artery was also identified and carefully circumferentially dissected. The space between the cystic artery and hepatocystic plate was developed such that a good view of the liver could be seen through a window medial to the cystic artery. The triangle of Calot had been cleared of all fibrofatty tissue. At this point, a critical view of safety was achieved and the only structures visualized was the skeletonized cystic duct laterally, the skeletonized cystic artery and the liver through the window medial to the artery. No posterior cystic artery was noted  A cholangiogram was not performed at this point as her cystic duct was rather short coming off the infundibulum and risk for back wall common duct injury was felt to be higher with attempts at passing a cholangiocatheter.   The cystic duct and artery  were clipped with 2 clips on the patient side and 1 clip on the  specimen side. The cystic duct and artery were then divided. The gallbladder was then freed from its remaining attachments to the liver using electrocautery and placed into an endocatch bag. The RUQ was gently irrigated with sterile saline. Hemostasis was then verified. The clips were in good position; the gallbladder fossa was dry.  The endocatch bag containing the gallbladder was then removed from the umbilical port site and passed off as specimen. The umbilical fascia was then closed using the 0 Vicryl purse-string suture.  An additional 0 Vicryl stitch was placed using a laparoscopic suture passer.  The fascia was palpated and noted to be completely closed.  The right upper quadrant ports were removed and noted to be hemostatic.  The skin of all incision sites was approximated with 4-0 monocryl subcuticular suture and dermabond applied. She was then awakened from anesthesia, extubated, and transferred to a stretcher for transport to PACU in satisfactory condition.

## 2020-05-07 ENCOUNTER — Encounter (HOSPITAL_COMMUNITY): Payer: Self-pay | Admitting: Surgery

## 2020-05-07 LAB — SURGICAL PATHOLOGY

## 2020-05-07 MED ORDER — METHOCARBAMOL 500 MG PO TABS: 500.0000 mg | ORAL_TABLET | Freq: Four times a day (QID) | ORAL | Status: DC | PRN

## 2020-05-07 MED ORDER — IBUPROFEN 200 MG PO TABS: 600.0000 mg | ORAL_TABLET | Freq: Three times a day (TID) | ORAL | 2 refills | Status: AC | PRN

## 2020-05-07 MED ORDER — KETOROLAC TROMETHAMINE 15 MG/ML IJ SOLN
15.0000 mg | Freq: Four times a day (QID) | INTRAMUSCULAR | Status: DC | PRN
  Administered 2020-05-07: 15 mg via INTRAVENOUS
  Filled 2020-05-07: qty 1

## 2020-05-07 MED ORDER — ACETAMINOPHEN 500 MG PO TABS
1000.0000 mg | ORAL_TABLET | Freq: Three times a day (TID) | ORAL | Status: DC
  Administered 2020-05-07: 1000 mg via ORAL
  Filled 2020-05-07: qty 2

## 2020-05-07 MED ORDER — DOCUSATE SODIUM 100 MG PO CAPS
100.0000 mg | ORAL_CAPSULE | Freq: Two times a day (BID) | ORAL | Status: DC
  Administered 2020-05-07: 100 mg via ORAL
  Filled 2020-05-07: qty 1

## 2020-05-07 MED ORDER — ONDANSETRON 4 MG PO TBDP: 4.0000 mg | ORAL_TABLET | Freq: Four times a day (QID) | ORAL | 0 refills | Status: AC | PRN

## 2020-05-07 MED ORDER — ACETAMINOPHEN 500 MG PO TABS: 1000.0000 mg | ORAL_TABLET | Freq: Four times a day (QID) | ORAL | 0 refills | Status: AC | PRN

## 2020-05-07 NOTE — Progress Notes (Signed)
[facility] Progress Note  1 Day Post-Op  Subjective: CC-  Having a lot of RUQ discomfort. Nausea at times, no emesis. No flatus or BM. Tolerated solid food last night after surgery. Has only had some liquids so far this morning. Got up to ambulate but had a lot of pain so she came back to bed. Oxycodone isn't helping.  Objective: Vital signs in last 24 hours: Temp:  [97.9 F (36.6 C)-99 F (37.2 C)] 97.9 F (36.6 C) (02/11 0543) Pulse Rate:  [64-99] 90 (02/11 0543) Resp:  [10-17] 17 (02/11 0543) BP: (94-120)/(49-77) 105/65 (02/11 0543) SpO2:  [93 %-100 %] 95 % (02/11 0543) Last BM Date: 05/05/20  Intake/Output from previous day: 02/10 0701 - 02/11 0700 In: 1716.7 [P.O.:180; I.V.:1536.7] Out: 620 [Urine:600; Blood:20] Intake/Output this shift: Total I/O In: -  Out: 300 [Urine:300]  PE: Gen:  Alert, NAD, pleasant Card:  RRR Pulm:  rate and effort normal Abd: Soft, ND, hypoactive BS, lap incisions C/D/I, appropriately tender in the RUQ, no peritonitis  Psych: A&Ox4  Skin: no rashes noted, warm and dry  Lab Results:  Recent Labs    05/05/20 1636 05/06/20 0511  WBC 7.4 5.5  HGB 12.7 10.9*  HCT 38.6 34.5*  PLT 223 159   BMET Recent Labs    05/05/20 1636 05/06/20 0511  NA 136 137  K 3.7 3.9  CL 103 105  CO2 26 25  GLUCOSE 101* 101*  BUN 17 13  CREATININE 0.62 0.76  CALCIUM 8.9 8.1*   PT/INR No results for input(s): LABPROT, INR in the last 72 hours. CMP     Component Value Date/Time   NA 137 05/06/2020 0511   K 3.9 05/06/2020 0511   CL 105 05/06/2020 0511   CO2 25 05/06/2020 0511   GLUCOSE 101 (H) 05/06/2020 0511   BUN 13 05/06/2020 0511   CREATININE 0.76 05/06/2020 0511   CALCIUM 8.1 (L) 05/06/2020 0511   PROT 5.6 (L) 05/06/2020 0511   ALBUMIN 3.1 (L) 05/06/2020 0511   AST 39 05/06/2020 0511   ALT 38 05/06/2020 0511   ALKPHOS 27 (L) 05/06/2020 0511   BILITOT 1.2 05/06/2020 0511   GFRNONAA >60 05/06/2020 0511   Lipase      Component Value Date/Time   LIPASE 40 05/05/2020 1636       Studies/Results: US Abdomen Complete  Result Date: 05/05/2020 CLINICAL DATA:  Right upper quadrant pain, nausea and vomiting over the last 2 days. EXAM: ABDOMEN ULTRASOUND COMPLETE COMPARISON:  None. FINDINGS: Gallbladder: 2 cm gallstone in the gallbladder neck. Positive Murphy sign. Wall thickness upper limits of normal. Findings worrisome for potential cholecystitis. Common bile duct: Diameter: 2.1 mm, normal Liver: No focal lesion identified. Within normal limits in parenchymal echogenicity. Portal vein is patent on color Doppler imaging with normal direction of blood flow towards the liver. IVC: No abnormality visualized. Pancreas: Poorly seen because of overlying bowel gas. Spleen: Size and appearance within normal limits. Right Kidney: Length: 11.4 cm. Echogenicity within normal limits. No mass or hydronephrosis visualized. Left Kidney: Length: 10.3 cm. Echogenicity within normal limits. No mass or hydronephrosis visualized. Abdominal aorta: No aneurysm visualized. Other findings: No ascites. IMPRESSION: 2 cm gallstone lodged in the gallbladder neck. Positive sonographic Murphy sign. Wall thickness within normal limits. No intra or extrahepatic biliary ductal dilatation. Findings could indicate early cholecystitis. Consider nuclear medicine study if question persists. Electronically Signed   By: Paulina Fusi M.D.   On: 05/05/2020 17:15    Anti-infectives:  Anti-infectives (From admission, onward)   Start     Dose/Rate Route Frequency Ordered Stop   05/05/20 2115  ciprofloxacin (CIPRO) IVPB 400 mg  Status:  Discontinued        400 mg 200 mL/hr over 60 Minutes Intravenous Every 12 hours 05/05/20 2109 05/06/20 1413   05/05/20 2115  ciprofloxacin (CIPRO) IVPB 400 mg  Status:  Discontinued        400 mg 200 mL/hr over 60 Minutes Intravenous Every 12 hours 05/05/20 2109 05/05/20 2119       Assessment/Plan  Acute  cholecystitis S/p laparoscopic cholecystectomy 2/10 Dr. Cliffton Asters - POD#1 - abdomen soft/nondistended and VSS - Add IV toradol and scheduled tylenol. Continue mobilizing. Diet as tolerated. Will recheck later today for possible discharge.  ID - cipro periop FEN - IVF@100cc /hr, reg diet VTE - SCDs, lovenox Foley - none Follow up - [facility]   LOS: 1 day    Franne Forts, [facility] 05/07/2020, 9:08 AM Please see Amion for pager number during day hours 7:00am-4:30pm

## 2020-05-07 NOTE — Progress Notes (Signed)
Patient and mother was given discharge instructions, and all questions were answered.  Patient was stable for discharge and was taken to the main exit by wheelchair.

## 2020-05-17 ENCOUNTER — Other Ambulatory Visit: Payer: Self-pay

## 2020-05-17 ENCOUNTER — Emergency Department (HOSPITAL_BASED_OUTPATIENT_CLINIC_OR_DEPARTMENT_OTHER)
Admission: EM | Admit: 2020-05-17 | Discharge: 2020-05-17 | Disposition: A | Discharge status: Home / Self Care | Payer: 59 | Attending: Emergency Medicine | Admitting: Emergency Medicine

## 2020-05-17 ENCOUNTER — Emergency Department (HOSPITAL_BASED_OUTPATIENT_CLINIC_OR_DEPARTMENT_OTHER): Payer: 59

## 2020-05-17 DIAGNOSIS — S29012A Strain of muscle and tendon of back wall of thorax, initial encounter: Secondary | ICD-10-CM | POA: Insufficient documentation

## 2020-05-17 DIAGNOSIS — S199XXA Unspecified injury of neck, initial encounter: Secondary | ICD-10-CM | POA: Diagnosis present

## 2020-05-17 DIAGNOSIS — T148XXA Other injury of unspecified body region, initial encounter: Secondary | ICD-10-CM

## 2020-05-17 DIAGNOSIS — S161XXA Strain of muscle, fascia and tendon at neck level, initial encounter: Secondary | ICD-10-CM | POA: Insufficient documentation

## 2020-05-17 DIAGNOSIS — Y92410 Unspecified street and highway as the place of occurrence of the external cause: Secondary | ICD-10-CM | POA: Insufficient documentation

## 2020-05-17 LAB — PREGNANCY, URINE: Preg Test, Ur: NEGATIVE

## 2020-05-17 MED ORDER — METHOCARBAMOL 500 MG PO TABS: 500.0000 mg | ORAL_TABLET | Freq: Three times a day (TID) | ORAL | 0 refills | Status: AC | PRN

## 2020-05-17 MED ORDER — NAPROXEN 500 MG PO TABS: 500.0000 mg | ORAL_TABLET | Freq: Two times a day (BID) | ORAL | 0 refills | Status: AC | PRN

## 2020-05-17 MED ORDER — KETOROLAC TROMETHAMINE 60 MG/2ML IM SOLN
30.0000 mg | Freq: Once | INTRAMUSCULAR | Status: AC
  Administered 2020-05-17: 30 mg via INTRAMUSCULAR
  Filled 2020-05-17: qty 2

## 2020-05-17 MED ORDER — ACETAMINOPHEN 325 MG PO TABS
650.0000 mg | ORAL_TABLET | Freq: Once | ORAL | Status: AC
  Administered 2020-05-17: 650 mg via ORAL
  Filled 2020-05-17: qty 2

## 2020-05-17 NOTE — ED Notes (Signed)
At end of triage Pt stated that she felt nauseas and weak and that her knees might buckle. Still complains of head neck back and abdominal pain with her neck hurting the most

## 2020-05-17 NOTE — ED Notes (Signed)
ED Provider at bedside. 

## 2020-05-17 NOTE — ED Notes (Signed)
Hard collar placed in triage
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
## 2020-05-17 NOTE — Discharge Instructions (Signed)
Take naproxen as needed for pain and Robaxin as needed for muscle spasms.  Return to ER for uncontrolled pain, abdominal pain, vomiting or other new concerning symptom.  Follow-up with your primary doctor for recheck later this week.
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
